# Patient Record
Sex: Male | Born: 1995 | Race: Black or African American | Hispanic: No | Marital: Single | Smoking: Current every day smoker
Health system: Southern US, Community
[De-identification: ages and names within clinical notes are randomized; demographics above are authoritative.]

## PROBLEM LIST (undated history)

## (undated) ENCOUNTER — Emergency Department (HOSPITAL_COMMUNITY): Payer: No Typology Code available for payment source

---
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
## 2018-10-21 ENCOUNTER — Emergency Department
Admission: EM | Admit: 2018-10-21 | Discharge: 2018-10-21 | Disposition: A | Discharge status: Home / Self Care | Payer: Federal, State, Local not specified - PPO | Attending: Emergency Medicine | Admitting: Emergency Medicine

## 2018-10-21 ENCOUNTER — Other Ambulatory Visit: Payer: Self-pay

## 2018-10-21 ENCOUNTER — Encounter: Payer: Self-pay | Admitting: Emergency Medicine

## 2018-10-21 DIAGNOSIS — Z202 Contact with and (suspected) exposure to infections with a predominantly sexual mode of transmission: Secondary | ICD-10-CM | POA: Insufficient documentation

## 2018-10-21 DIAGNOSIS — R300 Dysuria: Secondary | ICD-10-CM | POA: Diagnosis present

## 2018-10-21 DIAGNOSIS — F17210 Nicotine dependence, cigarettes, uncomplicated: Secondary | ICD-10-CM | POA: Diagnosis not present

## 2018-10-21 LAB — URINALYSIS, COMPLETE (UACMP) WITH MICROSCOPIC
Bacteria, UA: NONE SEEN
Bilirubin Urine: NEGATIVE
Glucose, UA: NEGATIVE mg/dL
Hgb urine dipstick: NEGATIVE
Ketones, ur: NEGATIVE mg/dL
Leukocytes,Ua: NEGATIVE
Nitrite: NEGATIVE
Protein, ur: NEGATIVE mg/dL
Specific Gravity, Urine: 1.018 (ref 1.005–1.030)
pH: 8 (ref 5.0–8.0)

## 2018-10-21 MED ORDER — METRONIDAZOLE 250 MG PO TABS: 250.0000 mg | ORAL_TABLET | Freq: Three times a day (TID) | ORAL | 0 refills | Status: AC

## 2018-10-21 MED ORDER — SULFACETAMIDE SODIUM 10 % OP SOLN: 1.0000 [drp] | OPHTHALMIC | 0 refills | Status: AC

## 2018-10-21 NOTE — Discharge Instructions (Signed)
Trichomonas is hard to evaluate in a male.  Due to your history of positive exposure will be treated prophylactically.  Advised to follow-up with the [facility] for further evaluation and treatment.

## 2018-10-21 NOTE — ED Provider Notes (Signed)
[facility] Emergency Department Provider Note   ____________________________________________   First MD Initiated Contact with Patient 10/21/18 [phone]     (approximate)  I have reviewed the triage vital signs and the nursing notes.   HISTORY  Chief Complaint SEXUALLY TRANSMITTED DISEASE    HPI Kyle Frost is a 23 y.o. male patient complaining dysuria when voiding.  Patient state he feels like he is not completely emptying his bladder.  Patient denies urethral discharge.  Patient denies high risk sexual encounters.  Patient just related that he found out his sexual partner has trichomonas.         History reviewed. No pertinent past medical history.  There are no active problems to display for this patient.   History reviewed. No pertinent surgical history.  Prior to Admission medications   Medication Sig Start Date End Date Taking? Authorizing Provider  metroNIDAZOLE (FLAGYL) 250 MG tablet Take 1 tablet (250 mg total) by mouth 3 (three) times daily for 7 days. 10/21/18 10/28/18  Joni ReiningSmith, Ronald K, PA-C  sulfacetamide (BLEPH-10) 10 % ophthalmic solution Place 1 drop into the right eye every 4 (four) hours for 10 days. 10/21/18 10/31/18  Joni ReiningSmith, Ronald K, PA-C    Allergies Patient has no known allergies.  No family history on file.  Social History Social History   Tobacco Use  . Smoking status: Current Every Day Smoker    Packs/day: 0.50    Types: Cigarettes  . Smokeless tobacco: Never Used  Substance Use Topics  . Alcohol use: No  . Drug use: Yes    Types: Marijuana    Review of Systems Constitutional: No fever/chills Eyes: No visual changes. ENT: No sore throat. Cardiovascular: Denies chest pain. Respiratory: Denies shortness of breath. Gastrointestinal: No abdominal pain.  No nausea, no vomiting.  No diarrhea.  No constipation. Genitourinary: Dysuria.   Musculoskeletal: Negative for back pain. Skin: Negative for rash.  Neurological: Negative for headaches, focal weakness or numbness.   ____________________________________________   PHYSICAL EXAM:  VITAL SIGNS: ED Triage Vitals [10/21/18 0929]  Enc Vitals Group     BP (!) 134/116     Pulse Rate (!) 54     Resp 20     Temp 98.4 F (36.9 C)     Temp Source Oral     SpO2 97 %     Weight 200 lb (90.7 kg)     Height 6\' 3"  (1.905 m)     Head Circumference      Peak Flow      Pain Score 3     Pain Loc      Pain Edu?      Excl. in GC?     Constitutional: Alert and oriented. Well appearing and in no acute distress. Cardiovascular: Normal rate, regular rhythm. Grossly normal heart sounds.  Good peripheral circulation. Respiratory: Normal respiratory effort.  No retractions. Lungs CTAB. Gastrointestinal: Soft and nontender. No distention. No abdominal bruits. No CVA tenderness. Genitourinary: No lesions or urethral drainage visible. Skin:  Skin is warm, dry and intact. No rash noted. Psychiatric: Mood and affect are normal. Speech and behavior are normal.  ____________________________________________   LABS (all labs ordered are listed, but only abnormal results are displayed)  Labs Reviewed  URINALYSIS, COMPLETE (UACMP) WITH MICROSCOPIC - Abnormal; Notable for the following components:      Result Value   Color, Urine YELLOW (*)    APPearance CLEAR (*)    All other components within normal limits  ____________________________________________  EKG   ____________________________________________  RADIOLOGY  ED MD interpretation:    Official radiology report(s): No results found.  ____________________________________________   PROCEDURES  Procedure(s) performed (including Critical Care):  Procedures   ____________________________________________   INITIAL IMPRESSION / ASSESSMENT AND PLAN / ED COURSE  As part of my medical decision making, I reviewed the following data within the [place] was evaluated in Emergency Department on 10/21/2018 for the symptoms described in the history of present illness. He was evaluated in the context of the global COVID-19 pandemic, which necessitated consideration that the patient might be at risk for infection with the SARS-CoV-2 virus that causes COVID-19. Institutional protocols and algorithms that pertain to the evaluation of patients at risk for COVID-19 are in a state of rapid change based on information released by regulatory bodies including the CDC and federal and state organizations. These policies and algorithms were followed during the patient's care in the ED.  Patient presents with dysuria.  Patient state he feels he is unable to fully empty his bladder.  After presenting urine specimen bladder scan was unremarkable.  While patient was awaiting urinalysis results he was informed by significant other says she tested positive for trichomonas.  Patient urinalysis unremarkable.  Patient will be prophylactically treated for trichomonas and advised follow-up with [facility] for for further evaluation and treatment.       ____________________________________________   FINAL CLINICAL IMPRESSION(S) / ED DIAGNOSES  Final diagnoses:  Possible exposure to STD     ED Discharge Orders         Ordered    sulfacetamide (BLEPH-10) 10 % ophthalmic solution  Every 4 hours     10/21/18 1011    metroNIDAZOLE (FLAGYL) 250 MG tablet  3 times daily     10/21/18 1034           Note:  This document was prepared using Dragon voice recognition software and may include unintentional dictation errors.    Sable Feil, PA-C 10/21/18 1035    Earleen Newport, MD 10/21/18 1335

## 2018-10-21 NOTE — ED Notes (Signed)
Pt is being discharged to home. Pt is Aox4, VSS, pt does not show any signs of distress. AVS/RX was given and explained to the pt and he verbalized understanding of all information.

## 2018-10-21 NOTE — ED Triage Notes (Signed)
Pt states when he pees it feels like not everything is coming out. Pt denies d/c

## 2019-02-09 ENCOUNTER — Ambulatory Visit: Payer: Self-pay

## 2019-04-08 ENCOUNTER — Other Ambulatory Visit: Payer: Self-pay

## 2019-04-08 ENCOUNTER — Emergency Department: Payer: Federal, State, Local not specified - PPO

## 2019-04-08 ENCOUNTER — Emergency Department
Admission: EM | Admit: 2019-04-08 | Discharge: 2019-04-08 | Disposition: A | Discharge status: Home / Self Care | Payer: Federal, State, Local not specified - PPO | Attending: Emergency Medicine | Admitting: Emergency Medicine

## 2019-04-08 ENCOUNTER — Encounter: Payer: Self-pay | Admitting: Emergency Medicine

## 2019-04-08 DIAGNOSIS — F17210 Nicotine dependence, cigarettes, uncomplicated: Secondary | ICD-10-CM | POA: Insufficient documentation

## 2019-04-08 DIAGNOSIS — F1210 Cannabis abuse, uncomplicated: Secondary | ICD-10-CM | POA: Insufficient documentation

## 2019-04-08 DIAGNOSIS — R112 Nausea with vomiting, unspecified: Secondary | ICD-10-CM | POA: Insufficient documentation

## 2019-04-08 DIAGNOSIS — R1011 Right upper quadrant pain: Secondary | ICD-10-CM | POA: Diagnosis present

## 2019-04-08 LAB — URINALYSIS, ROUTINE W REFLEX MICROSCOPIC
Bacteria, UA: NONE SEEN
Bilirubin Urine: NEGATIVE
Glucose, UA: NEGATIVE mg/dL
Hgb urine dipstick: NEGATIVE
Ketones, ur: NEGATIVE mg/dL
Nitrite: NEGATIVE
Protein, ur: 100 mg/dL — AB
Specific Gravity, Urine: 1.019 (ref 1.005–1.030)
Squamous Epithelial / LPF: NONE SEEN (ref 0–5)
pH: 9 — ABNORMAL HIGH (ref 5.0–8.0)

## 2019-04-08 LAB — COMPREHENSIVE METABOLIC PANEL
ALT: 28 U/L (ref 0–44)
AST: 29 U/L (ref 15–41)
Albumin: 4.8 g/dL (ref 3.5–5.0)
Alkaline Phosphatase: 59 U/L (ref 38–126)
Anion gap: 9 (ref 5–15)
BUN: 13 mg/dL (ref 6–20)
CO2: 23 mmol/L (ref 22–32)
Calcium: 9.5 mg/dL (ref 8.9–10.3)
Chloride: 107 mmol/L (ref 98–111)
Creatinine, Ser: 1 mg/dL (ref 0.61–1.24)
GFR calc Af Amer: >60 mL/min (ref >60)
GFR calc non Af Amer: >60 mL/min (ref >60)
Glucose, Bld: 110 mg/dL — ABNORMAL HIGH (ref 70–99)
Potassium: 4 mmol/L (ref 3.5–5.1)
Sodium: 139 mmol/L (ref 135–145)
Total Bilirubin: 1.6 mg/dL — ABNORMAL HIGH (ref 0.3–1.2)
Total Protein: 7.9 g/dL (ref 6.5–8.1)

## 2019-04-08 LAB — CBC WITH DIFFERENTIAL/PLATELET
Abs Immature Granulocytes: 0.06 10*3/uL (ref 0.00–0.07)
Basophils Absolute: 0.1 10*3/uL (ref 0.0–0.1)
Basophils Relative: 1 %
Eosinophils Absolute: 0 10*3/uL (ref 0.0–0.5)
Eosinophils Relative: 0 %
HCT: 47.4 % (ref 39.0–52.0)
Hemoglobin: 16 g/dL (ref 13.0–17.0)
Immature Granulocytes: 1 %
Lymphocytes Relative: 25 %
Lymphs Abs: 1.5 10*3/uL (ref 0.7–4.0)
MCH: 30 pg (ref 26.0–34.0)
MCHC: 33.8 g/dL (ref 30.0–36.0)
MCV: 88.8 fL (ref 80.0–100.0)
Monocytes Absolute: 0.4 10*3/uL (ref 0.1–1.0)
Monocytes Relative: 6 %
Neutro Abs: 4 10*3/uL (ref 1.7–7.7)
Neutrophils Relative %: 67 %
Platelets: 243 10*3/uL (ref 150–400)
RBC: 5.34 MIL/uL (ref 4.22–5.81)
RDW: 11.9 % (ref 11.5–15.5)
WBC: 6 10*3/uL (ref 4.0–10.5)
nRBC: 0 % (ref 0.0–0.2)

## 2019-04-08 LAB — LIPASE, BLOOD: Lipase: 22 U/L (ref 11–51)

## 2019-04-08 MED ORDER — ACETAMINOPHEN 500 MG PO TABS
1000.0000 mg | ORAL_TABLET | Freq: Once | ORAL | Status: AC
  Administered 2019-04-08: 1000 mg via ORAL
  Filled 2019-04-08: qty 2

## 2019-04-08 MED ORDER — ONDANSETRON HCL 4 MG/2ML IJ SOLN
4.0000 mg | Freq: Once | INTRAMUSCULAR | Status: AC
  Administered 2019-04-08: 4 mg via INTRAVENOUS
  Filled 2019-04-08: qty 2

## 2019-04-08 MED ORDER — SODIUM CHLORIDE 0.9 % IV BOLUS
1000.0000 mL | Freq: Once | INTRAVENOUS | Status: AC
  Administered 2019-04-08: 1000 mL via INTRAVENOUS

## 2019-04-08 NOTE — ED Provider Notes (Signed)
[facility] Emergency Department Provider Note  ____________________________________________   First MD Initiated Contact with Patient 04/08/19 1349     (approximate)  I have reviewed the triage vital signs and the nursing notes.   HISTORY  Chief Complaint Emesis and Abdominal Pain    HPI Kyle Frost is a 24 y.o. male otherwise healthy, no prior abdominal surgeries who comes in for abdominal pain.  Patient states that he took ecstasy around 1 AM.  He did drink alcohol with it.  He around 8 AM started feeling unwell.  He endorses having pain in his right upper quadrant that is constant, nothing makes better, nothing makes it worse associated with some nausea and vomiting.  Denies any cough or shortness of breath.  Denies having previously          History reviewed. No pertinent past medical history.  There are no problems to display for this patient.   History reviewed. No pertinent surgical history.  Prior to Admission medications   Not on File    Allergies Patient has no known allergies.  No family history on file.  Social History Social History   Tobacco Use  . Smoking status: Current Every Day Smoker    Packs/day: 0.50    Types: Cigarettes  . Smokeless tobacco: Never Used  Substance Use Topics  . Alcohol use: No  . Drug use: Yes    Types: Marijuana      Review of Systems Constitutional: No fever/chills Eyes: No visual changes. ENT: No sore throat. Cardiovascular: Denies chest pain. Respiratory: Denies shortness of breath. Gastrointestinal: Positive abdominal pain and nausea and vomiting Genitourinary: Negative for dysuria. Musculoskeletal: Negative for back pain. Skin: Negative for rash. Neurological: Negative for headaches, focal weakness or numbness. All other ROS negative ____________________________________________   PHYSICAL EXAM:  VITAL SIGNS: ED Triage Vitals  Enc Vitals Group     BP 04/08/19 1345  137/69     Pulse Rate 04/08/19 1345 (!) 58     Resp 04/08/19 1345 16     Temp 04/08/19 1345 98.2 F (36.8 C)     Temp Source 04/08/19 1345 Oral     SpO2 04/08/19 1345 100 %     Weight 04/08/19 1346 213 lb (96.6 kg)     Height 04/08/19 1346 6\' 3"  (1.905 m)     Head Circumference --      Peak Flow --      Pain Score 04/08/19 1346 7     Pain Loc --      Pain Edu? --      Excl. in GC? --     Constitutional: Alert and oriented. Well appearing and in no acute distress. Eyes: Conjunctivae are normal. EOMI. Head: Atraumatic. Nose: No congestion/rhinnorhea. Mouth/Throat: Mucous membranes are moist.   Neck: No stridor. Trachea Midline. FROM Cardiovascular: Normal rate, regular rhythm. Grossly normal heart sounds.  Good peripheral circulation. Respiratory: Normal respiratory effort.  No retractions. Lungs CTAB. Gastrointestinal: Slightly tender in the right upper quadrant without any rebound or guarding.  No lower abdominal tenderness Musculoskeletal: No lower extremity tenderness nor edema.  No joint effusions. Neurologic:  Normal speech and language. No gross focal neurologic deficits are appreciated.  Skin:  Skin is warm, dry and intact. No rash noted. Psychiatric: Mood and affect are normal. Speech and behavior are normal. GU: Deferred   ____________________________________________   LABS (all labs ordered are listed, but only abnormal results are displayed)  Labs Reviewed  CBC WITH DIFFERENTIAL/PLATELET  COMPREHENSIVE METABOLIC PANEL  LIPASE, BLOOD  URINALYSIS, ROUTINE W REFLEX MICROSCOPIC   ____________________________________________ RADIOLOGY Robert Bellow, personally viewed and evaluated these images (plain radiographs) as part of my medical decision making, as well as reviewing the written report by the radiologist.  ED MD interpretation: pending   Official radiology report(s): No results found.  ____________________________________________   PROCEDURES   Procedure(s) performed (including Critical Care):  Procedures   ____________________________________________   INITIAL IMPRESSION / ASSESSMENT AND PLAN / ED COURSE  Kyle Frost was evaluated in Emergency Department on 04/08/2019 for the symptoms described in the history of present illness. He was evaluated in the context of the global COVID-19 pandemic, which necessitated consideration that the patient might be at risk for infection with the SARS-CoV-2 virus that causes COVID-19. Institutional protocols and algorithms that pertain to the evaluation of patients at risk for COVID-19 are in a state of rapid change based on information released by regulatory bodies including the CDC and federal and state organizations. These policies and algorithms were followed during the patient's care in the ED.    Patient is a well-appearing 24 year old who comes in after using ecstasy who now has right upper quadrant pain.  Given patient is tender in his right upper quadrant will get a ultrasound to make sure is no evidence of gallbladder pathology.  Will get labs to evaluate for cholecystitis, pancreatitis.  Will give symptomatic treatment with Tylenol Zofran and fluids.  At this time I have low suspicion for SBO versus appendicitis or other acute abdominal pathologies and will hold off on CT scan.  Patient handed off to oncoming team pending labs, ultrasound and reevaluation.  If tolerating p.o. suspect discharge home       ____________________________________________   FINAL CLINICAL IMPRESSION(S) / ED DIAGNOSES   Final diagnoses:  RUQ pain      MEDICATIONS GIVEN DURING THIS VISIT:  Medications  ondansetron (ZOFRAN) injection 4 mg (has no administration in time range)  sodium chloride 0.9 % bolus 1,000 mL (has no administration in time range)  acetaminophen (TYLENOL) tablet 1,000 mg (has no administration in time range)     ED Discharge Orders    None       Note:  This document  was prepared using Dragon voice recognition software and may include unintentional dictation errors.   Vanessa C[place], MD 04/08/19 1420

## 2019-04-08 NOTE — ED Notes (Signed)
Pt provided with blankets

## 2019-04-08 NOTE — ED Triage Notes (Signed)
Patient reports taking half of an ecstacy pill at approximately 1 am. Reports since then, he has had severe abdominal pain, nausea and vomiting. Patient denies taking this pill or anything else from this person in the past.

## 2019-04-08 NOTE — ED Notes (Signed)
Pt up to use bathroom 

## 2019-04-08 NOTE — ED Provider Notes (Signed)
-----------------------------------------   5:19 PM on 04/08/2019 -----------------------------------------  Patient's work-up is essentially negative.  Reassuring lab work, normal ultrasound.  Patient states he is feeling much better at this time after fluids.  I discussed continued supportive care at home including plenty of fluids and rest.  Discussed return precautions for return of pain or fever.  Patient agreeable to plan of care.   Minna Antis, MD 04/08/19 1719

## 2019-06-09 ENCOUNTER — Emergency Department
Admission: EM | Admit: 2019-06-09 | Discharge: 2019-06-09 | Disposition: A | Discharge status: Home / Self Care | Payer: Federal, State, Local not specified - PPO | Attending: Emergency Medicine | Admitting: Emergency Medicine

## 2019-06-09 ENCOUNTER — Other Ambulatory Visit: Payer: Self-pay

## 2019-06-09 ENCOUNTER — Emergency Department: Payer: Federal, State, Local not specified - PPO

## 2019-06-09 DIAGNOSIS — M79602 Pain in left arm: Secondary | ICD-10-CM | POA: Insufficient documentation

## 2019-06-09 DIAGNOSIS — R2232 Localized swelling, mass and lump, left upper limb: Secondary | ICD-10-CM | POA: Insufficient documentation

## 2019-06-09 DIAGNOSIS — Z5321 Procedure and treatment not carried out due to patient leaving prior to being seen by health care provider: Secondary | ICD-10-CM | POA: Diagnosis not present
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
## 2019-10-27 ENCOUNTER — Ambulatory Visit: Payer: Federal, State, Local not specified - PPO

## 2019-10-28 ENCOUNTER — Other Ambulatory Visit: Payer: Self-pay

## 2019-10-28 ENCOUNTER — Ambulatory Visit: Payer: Self-pay | Admitting: Physician Assistant

## 2019-10-28 DIAGNOSIS — Z113 Encounter for screening for infections with a predominantly sexual mode of transmission: Secondary | ICD-10-CM

## 2019-10-28 DIAGNOSIS — Z202 Contact with and (suspected) exposure to infections with a predominantly sexual mode of transmission: Secondary | ICD-10-CM

## 2019-10-28 MED ORDER — AZITHROMYCIN 500 MG PO TABS
1000.0000 mg | ORAL_TABLET | Freq: Once | ORAL | Status: AC
  Administered 2019-10-28: 1000 mg via ORAL

## 2019-10-30 ENCOUNTER — Encounter: Payer: Self-pay | Admitting: Physician Assistant

## 2019-10-30 NOTE — Progress Notes (Signed)
   [facility] Department STI clinic/screening visit  Subjective:  Kyle Frost is a 24 y.o. male being seen today for an STI screening visit. The patient reports they do have symptoms.    Patient has the following medical conditions:  There are no problems to display for this patient.    Chief Complaint  Patient presents with  . SEXUALLY TRANSMITTED DISEASE    screening    HPI  Patient reports that he has had a slight clearish discharge for 1 week and he is a contact to Chlamydia.  Denies other symptoms, chronic conditions, surgeries and regular medicines.  Reports last HIV test was about 6 months ago.     See flowsheet for further details and programmatic requirements.    The following portions of the patient's history were reviewed and updated as appropriate: allergies, current medications, past medical history, past social history, past surgical history and problem list.  Objective:  There were no vitals filed for this visit.  Physical Exam Constitutional:      General: He is not in acute distress.    Appearance: Normal appearance.  HENT:     Head: Normocephalic and atraumatic.  Eyes:     Conjunctiva/sclera: Conjunctivae normal.  Pulmonary:     Effort: Pulmonary effort is normal.  Skin:    General: Skin is warm and dry.  Neurological:     Mental Status: He is alert and oriented to person, place, and time.  Psychiatric:        Mood and Affect: Mood normal.        Behavior: Behavior normal.        Thought Content: Thought content normal.        Judgment: Judgment normal.       Assessment and Plan:  Kyle Frost is a 24 y.o. male presenting to the [facility] Department for STI screening  1. Screening for STD (sexually transmitted disease) Patient into clinic with symptoms. Patient declines screening exam and blood work today.  Requests treatment as a contact to Chlamydia only. Rec condoms with all sex.   2. Chlamydia  contact Will treat as a contact to Chlamydia with Azithromycin 1 g po DOT today. No sex for 7 days and until after partner completes treatment. RTC for re-treatment if vomits < 2 hr after taking medicine. - azithromycin (ZITHROMAX) tablet 1,000 mg     No follow-ups on file.  No future appointments.  Matt Holmes, PA

## 2020-02-27 ENCOUNTER — Encounter: Payer: Self-pay | Admitting: Emergency Medicine

## 2020-02-27 ENCOUNTER — Emergency Department: Payer: Federal, State, Local not specified - PPO

## 2020-02-27 ENCOUNTER — Other Ambulatory Visit: Payer: Self-pay

## 2020-02-27 ENCOUNTER — Emergency Department
Admission: EM | Admit: 2020-02-27 | Discharge: 2020-02-27 | Disposition: A | Discharge status: Home / Self Care | Payer: Federal, State, Local not specified - PPO | Attending: Emergency Medicine | Admitting: Emergency Medicine

## 2020-02-27 DIAGNOSIS — Z79899 Other long term (current) drug therapy: Secondary | ICD-10-CM | POA: Diagnosis not present

## 2020-02-27 DIAGNOSIS — M25552 Pain in left hip: Secondary | ICD-10-CM | POA: Insufficient documentation

## 2020-02-27 DIAGNOSIS — F17210 Nicotine dependence, cigarettes, uncomplicated: Secondary | ICD-10-CM | POA: Diagnosis not present

## 2020-02-27 MED ORDER — IBUPROFEN 800 MG PO TABS: 800.0000 mg | ORAL_TABLET | Freq: Three times a day (TID) | ORAL | 0 refills | Status: DC | PRN

## 2020-02-27 MED ORDER — CYCLOBENZAPRINE HCL 5 MG PO TABS: 5.0000 mg | ORAL_TABLET | Freq: Three times a day (TID) | ORAL | 0 refills | Status: DC | PRN
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
## 2020-02-27 NOTE — Discharge Instructions (Signed)
Your exam and XR are consistent with a hip bursitis, and hamstring strain. Take the prescription meds as directed. Apply ice and/or moist heat to reduce symptoms. Return to the ED or see Ortho for ongoing symptoms.

## 2020-07-27 ENCOUNTER — Encounter: Payer: Self-pay | Admitting: Emergency Medicine

## 2020-07-27 ENCOUNTER — Emergency Department
Admission: EM | Admit: 2020-07-27 | Discharge: 2020-07-27 | Disposition: A | Discharge status: Home / Self Care | Payer: Federal, State, Local not specified - PPO | Attending: Physician Assistant | Admitting: Physician Assistant

## 2020-07-27 ENCOUNTER — Other Ambulatory Visit: Payer: Self-pay

## 2020-07-27 DIAGNOSIS — S39012A Strain of muscle, fascia and tendon of lower back, initial encounter: Secondary | ICD-10-CM | POA: Diagnosis not present

## 2020-07-27 DIAGNOSIS — Y92410 Unspecified street and highway as the place of occurrence of the external cause: Secondary | ICD-10-CM | POA: Insufficient documentation

## 2020-07-27 DIAGNOSIS — L299 Pruritus, unspecified: Secondary | ICD-10-CM | POA: Insufficient documentation

## 2020-07-27 DIAGNOSIS — S3992XA Unspecified injury of lower back, initial encounter: Secondary | ICD-10-CM | POA: Diagnosis present

## 2020-07-27 DIAGNOSIS — F17210 Nicotine dependence, cigarettes, uncomplicated: Secondary | ICD-10-CM | POA: Insufficient documentation

## 2020-07-27 DIAGNOSIS — Z5321 Procedure and treatment not carried out due to patient leaving prior to being seen by health care provider: Secondary | ICD-10-CM | POA: Insufficient documentation

## 2020-07-27 MED ORDER — DIPHENHYDRAMINE HCL 25 MG PO CAPS
50.0000 mg | ORAL_CAPSULE | Freq: Once | ORAL | Status: AC
  Administered 2020-07-27: 50 mg via ORAL
  Filled 2020-07-27: qty 2

## 2020-07-27 MED ORDER — LIDOCAINE 5 % EX PTCH: 1.0000 | MEDICATED_PATCH | Freq: Two times a day (BID) | CUTANEOUS | 0 refills | Status: AC | PRN

## 2020-07-27 MED ORDER — CYCLOBENZAPRINE HCL 5 MG PO TABS: 5.0000 mg | ORAL_TABLET | Freq: Three times a day (TID) | ORAL | 0 refills | Status: DC | PRN
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
## 2020-07-28 ENCOUNTER — Emergency Department
Admission: EM | Admit: 2020-07-28 | Discharge: 2020-07-28 | Disposition: A | Discharge status: Home / Self Care | Payer: Federal, State, Local not specified - PPO | Source: Home / Self Care

## 2020-07-28 NOTE — ED Notes (Signed)
No answer when called several times from lobby 

## 2020-08-09 ENCOUNTER — Other Ambulatory Visit: Payer: Self-pay | Admitting: Chiropractor

## 2020-08-09 ENCOUNTER — Ambulatory Visit
Admission: RE | Admit: 2020-08-09 | Discharge: 2020-08-09 | Disposition: A | Discharge status: Home / Self Care | Payer: Federal, State, Local not specified - PPO | Source: Ambulatory Visit | Attending: Chiropractor | Admitting: Chiropractor

## 2020-08-09 ENCOUNTER — Ambulatory Visit
Admission: RE | Admit: 2020-08-09 | Discharge: 2020-08-09 | Disposition: A | Discharge status: Home / Self Care | Payer: Federal, State, Local not specified - PPO | Attending: Chiropractor | Admitting: Chiropractor

## 2020-08-09 DIAGNOSIS — S233XXA Sprain of ligaments of thoracic spine, initial encounter: Secondary | ICD-10-CM | POA: Diagnosis present

## 2020-08-09 DIAGNOSIS — S335XXA Sprain of ligaments of lumbar spine, initial encounter: Secondary | ICD-10-CM | POA: Insufficient documentation

## 2020-11-24 ENCOUNTER — Emergency Department: Payer: Federal, State, Local not specified - PPO

## 2020-11-24 ENCOUNTER — Emergency Department
Admission: EM | Admit: 2020-11-24 | Discharge: 2020-11-25 | Disposition: A | Discharge status: Home / Self Care | Payer: Federal, State, Local not specified - PPO | Attending: Emergency Medicine | Admitting: Emergency Medicine

## 2020-11-24 DIAGNOSIS — S82832A Other fracture of upper and lower end of left fibula, initial encounter for closed fracture: Secondary | ICD-10-CM

## 2020-11-24 DIAGNOSIS — S8262XA Displaced fracture of lateral malleolus of left fibula, initial encounter for closed fracture: Secondary | ICD-10-CM | POA: Diagnosis not present

## 2020-11-24 DIAGNOSIS — T1490XA Injury, unspecified, initial encounter: Secondary | ICD-10-CM

## 2020-11-24 DIAGNOSIS — S9305XA Dislocation of left ankle joint, initial encounter: Secondary | ICD-10-CM | POA: Insufficient documentation

## 2020-11-24 DIAGNOSIS — S99912A Unspecified injury of left ankle, initial encounter: Secondary | ICD-10-CM | POA: Diagnosis present

## 2020-11-24 DIAGNOSIS — Z23 Encounter for immunization: Secondary | ICD-10-CM | POA: Diagnosis not present

## 2020-11-24 DIAGNOSIS — F17210 Nicotine dependence, cigarettes, uncomplicated: Secondary | ICD-10-CM | POA: Diagnosis not present

## 2020-11-24 DIAGNOSIS — M25572 Pain in left ankle and joints of left foot: Secondary | ICD-10-CM | POA: Diagnosis not present

## 2020-11-24 MED ORDER — NAPROXEN 500 MG PO TABS: 500.0000 mg | ORAL_TABLET | Freq: Two times a day (BID) | ORAL | 0 refills | Status: DC

## 2020-11-24 MED ORDER — TETANUS-DIPHTH-ACELL PERTUSSIS 5-2.5-18.5 LF-MCG/0.5 IM SUSY
0.5000 mL | PREFILLED_SYRINGE | Freq: Once | INTRAMUSCULAR | Status: AC
  Administered 2020-11-24: 0.5 mL via INTRAMUSCULAR
  Filled 2020-11-24: qty 0.5

## 2020-11-24 MED ORDER — OXYCODONE-ACETAMINOPHEN 5-325 MG PO TABS
1.0000 | ORAL_TABLET | Freq: Once | ORAL | Status: AC
  Administered 2020-11-24: 1 via ORAL
  Filled 2020-11-24: qty 1

## 2020-11-24 MED ORDER — OXYCODONE-ACETAMINOPHEN 5-325 MG PO TABS: 1.0000 | ORAL_TABLET | Freq: Four times a day (QID) | ORAL | 0 refills | Status: DC | PRN

## 2020-11-24 MED ORDER — HYDROMORPHONE HCL 1 MG/ML IJ SOLN
1.0000 mg | INTRAMUSCULAR | Status: AC
  Administered 2020-11-24: 1 mg via INTRAMUSCULAR
  Filled 2020-11-24: qty 1
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
## 2020-11-24 NOTE — ED Notes (Signed)
Abrasions on left ankle, left shin and below left knee were cleaned, dried, non-adherent bandages applied and wrapped in kling. Patient tolerated procedure well. Left hand soaking in NS and chlorhexidine at this time per Dr. Scotty Court.
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
## 2020-11-24 NOTE — ED Triage Notes (Signed)
Obvious deformity of the left ankle with decreased range of motion.

## 2020-11-24 NOTE — ED Notes (Signed)
Girlfriend at bedside.

## 2020-11-25 NOTE — ED Notes (Signed)
Report given to Amanda RN

## 2020-12-04 ENCOUNTER — Other Ambulatory Visit: Payer: Self-pay | Admitting: Orthopedic Surgery

## 2020-12-05 ENCOUNTER — Other Ambulatory Visit: Payer: Self-pay

## 2020-12-05 ENCOUNTER — Encounter
Admission: RE | Admit: 2020-12-05 | Discharge: 2020-12-05 | Disposition: A | Discharge status: Home / Self Care | Payer: No Typology Code available for payment source | Source: Ambulatory Visit | Attending: Orthopedic Surgery | Admitting: Orthopedic Surgery

## 2020-12-05 NOTE — Patient Instructions (Signed)
Your procedure is scheduled on: Thurs 11/3 Report to registration desk then to Day Surgery. To find out your arrival time please call [phone] between 1PM - 3PM on Wed. 11/2.  Remember: Instructions that are not followed completely may result in serious medical risk,  up to and including death, or upon the discretion of your surgeon and anesthesiologist your  surgery may need to be rescheduled.     _X__ 1. Do not eat food after midnight the night before your procedure.                 No chewing gum or hard candies. You may drink clear liquids up to 2 hours                 before you are scheduled to arrive for your surgery- DO not drink clear                 liquids within 2 hours of the start of your surgery.                 Clear Liquids include:  water, apple juice without pulp, clear Gatorade, G2 or                  Gatorade Zero (avoid Red/Purple/Blue), Black Coffee or Tea (Do not add                 anything to coffee or tea).  __X__2.  On the morning of surgery brush your teeth with toothpaste and water, you                may rinse your mouth with mouthwash if you wish.  Do not swallow any   toothpaste of mouthwash.     _X__ 3.  No Alcohol for 24 hours before or after surgery.   _X__ 4.  Do Not Smoke or use e-cigarettes For 24 Hours Prior to Your Surgery.                 Do not use any chewable tobacco products for at least 6 hours prior to                 Surgery.  _X__  5.  Do not use any recreational drugs (marijuana, cocaine, heroin, ecstasy,  MDMA or other) For at least one week prior to your surgery.   Combination of these drugs with anesthesia may have life threatening   results.  ____  6.  Bring all medications with you on the day of surgery if instructed.   __x__  7.  Notify your doctor if there is any change in your medical condition      (cold, fever, infections).     Do not wear jewelry,  Do not wear lotions,  You may wear  deodorant. Do not shave 48 hours prior to surgery. Men may shave face and neck. Do not bring valuables to the hospital.    [facility] is not responsible for any belongings or valuables.  Contacts, dentures or bridgework may not be worn into surgery. Leave your suitcase in the car. After surgery it may be brought to your room. For patients admitted to the hospital, discharge time is determined by your treatment team.   Patients discharged the day of surgery will not be allowed to drive home.   Make arrangements for someone to be with you for the first 24 hours of your Same Day Discharge.    Please read  over the following fact sheets that you were given:      __x__ Take these medicines the morning of surgery with A SIP OF WATER:    1. none  2.   3.   4.  5.  6.  ____ Fleet Enema (as directed)   __x__ shower the night before and the morning of surgery.  ____ Use Benzoyl Peroxide Gel as instructed  ____ Use inhalers on the day of surgery  ____ Stop metformin 2 days prior to surgery    ____ Take 1/2 of usual insulin dose the night before surgery. No insulin the morning          of surgery.   ____ Stop Coumadin/Plavix/aspirin on   __x__ Stop Anti-inflammatories Naprosyn, ibuprofen or aspirin until after the surgery.    ____ Stop supplements until after surgery.    ____ Bring C-Pap to the hospital.    If you have any questions regarding your pre-procedure instructions,  Please call Pre-admit Testing at [phone]

## 2020-12-07 MED ORDER — FAMOTIDINE 20 MG PO TABS
20.0000 mg | ORAL_TABLET | Freq: Once | ORAL | Status: AC
  Administered 2020-12-14: 20 mg via ORAL

## 2020-12-07 MED ORDER — CHLORHEXIDINE GLUCONATE CLOTH 2 % EX PADS: 6.0000 | MEDICATED_PAD | Freq: Once | CUTANEOUS | Status: DC

## 2020-12-07 MED ORDER — LACTATED RINGERS IV SOLN: INTRAVENOUS | Status: DC

## 2020-12-07 MED ORDER — CHLORHEXIDINE GLUCONATE 0.12 % MT SOLN
15.0000 mL | Freq: Once | OROMUCOSAL | Status: AC
  Administered 2020-12-14: 15 mL via OROMUCOSAL

## 2020-12-07 MED ORDER — CEFAZOLIN SODIUM-DEXTROSE 2-4 GM/100ML-% IV SOLN: 2.0000 g | INTRAVENOUS | Status: AC

## 2020-12-07 MED ORDER — ACETAMINOPHEN 500 MG PO TABS: 1000.0000 mg | ORAL_TABLET | ORAL | Status: AC

## 2020-12-07 MED ORDER — ORAL CARE MOUTH RINSE: 15.0000 mL | Freq: Once | OROMUCOSAL | Status: AC

## 2020-12-14 ENCOUNTER — Ambulatory Visit: Payer: Federal, State, Local not specified - PPO | Admitting: Certified Registered"

## 2020-12-14 ENCOUNTER — Encounter: Payer: Self-pay | Admitting: Orthopedic Surgery

## 2020-12-14 ENCOUNTER — Ambulatory Visit: Payer: Federal, State, Local not specified - PPO

## 2020-12-14 ENCOUNTER — Encounter
Admission: RE | Disposition: A | Discharge status: Home / Self Care | Payer: Self-pay | Source: Home / Self Care | Attending: Orthopedic Surgery

## 2020-12-14 ENCOUNTER — Ambulatory Visit
Admission: RE | Admit: 2020-12-14 | Discharge: 2020-12-14 | Disposition: A | Discharge status: Home / Self Care | Payer: Federal, State, Local not specified - PPO | Attending: Orthopedic Surgery | Admitting: Orthopedic Surgery

## 2020-12-14 DIAGNOSIS — S93432A Sprain of tibiofibular ligament of left ankle, initial encounter: Secondary | ICD-10-CM | POA: Insufficient documentation

## 2020-12-14 DIAGNOSIS — S8263XA Displaced fracture of lateral malleolus of unspecified fibula, initial encounter for closed fracture: Secondary | ICD-10-CM

## 2020-12-14 DIAGNOSIS — S8261XA Displaced fracture of lateral malleolus of right fibula, initial encounter for closed fracture: Secondary | ICD-10-CM

## 2020-12-14 HISTORY — PX: ORIF ANKLE FRACTURE: SHX5408

## 2020-12-14 HISTORY — PX: SYNDESMOSIS REPAIR: SHX5182

## 2020-12-14 SURGERY — OPEN REDUCTION INTERNAL FIXATION (ORIF) ANKLE FRACTURE
Anesthesia: General | Site: Ankle | Laterality: Left

## 2020-12-14 MED ORDER — ONDANSETRON HCL 4 MG PO TABS: 4.0000 mg | ORAL_TABLET | Freq: Three times a day (TID) | ORAL | 0 refills | Status: AC | PRN

## 2020-12-14 MED ORDER — BUPIVACAINE HCL (PF) 0.5 % IJ SOLN
INTRAMUSCULAR | Status: AC
  Filled 2020-12-14: qty 30

## 2020-12-14 MED ORDER — NEOMYCIN-POLYMYXIN B GU 40-200000 IR SOLN
Status: AC
  Filled 2020-12-14: qty 2

## 2020-12-14 MED ORDER — OXYCODONE HCL 5 MG PO TABS: 5.0000 mg | ORAL_TABLET | ORAL | 0 refills | Status: AC | PRN

## 2020-12-14 MED ORDER — SODIUM CHLORIDE 0.9 % IR SOLN
Status: DC | PRN
  Administered 2020-12-14: 500 mL

## 2020-12-14 MED ORDER — PROPOFOL 10 MG/ML IV BOLUS
INTRAVENOUS | Status: DC | PRN
  Administered 2020-12-14: 30 mg via INTRAVENOUS
  Administered 2020-12-14: 200 mg via INTRAVENOUS
  Administered 2020-12-14: 50 mg via INTRAVENOUS
  Administered 2020-12-14: 40 mg via INTRAVENOUS
  Administered 2020-12-14 (×2): 50 mg via INTRAVENOUS

## 2020-12-14 MED ORDER — FENTANYL CITRATE (PF) 100 MCG/2ML IJ SOLN
INTRAMUSCULAR | Status: AC
  Filled 2020-12-14: qty 2

## 2020-12-14 MED ORDER — CHLORHEXIDINE GLUCONATE 0.12 % MT SOLN
OROMUCOSAL | Status: AC
  Filled 2020-12-14: qty 15

## 2020-12-14 MED ORDER — KETAMINE HCL 50 MG/5ML IJ SOSY
PREFILLED_SYRINGE | INTRAMUSCULAR | Status: AC
  Filled 2020-12-14: qty 5

## 2020-12-14 MED ORDER — HYDROMORPHONE HCL 1 MG/ML IJ SOLN
INTRAMUSCULAR | Status: AC
  Filled 2020-12-14: qty 1

## 2020-12-14 MED ORDER — PROPOFOL 1000 MG/100ML IV EMUL
INTRAVENOUS | Status: AC
  Filled 2020-12-14: qty 300

## 2020-12-14 MED ORDER — ASPIRIN EC 325 MG PO TBEC: 325.0000 mg | DELAYED_RELEASE_TABLET | Freq: Two times a day (BID) | ORAL | 0 refills | Status: AC

## 2020-12-14 MED ORDER — ONDANSETRON HCL 4 MG/2ML IJ SOLN
INTRAMUSCULAR | Status: DC | PRN
  Administered 2020-12-14: 4 mg via INTRAVENOUS

## 2020-12-14 MED ORDER — DEXMEDETOMIDINE HCL IN NACL 200 MCG/50ML IV SOLN
INTRAVENOUS | Status: DC | PRN
  Administered 2020-12-14 (×2): 8 ug via INTRAVENOUS
  Administered 2020-12-14 (×2): 12 ug via INTRAVENOUS

## 2020-12-14 MED ORDER — PROPOFOL 10 MG/ML IV BOLUS
INTRAVENOUS | Status: AC
  Filled 2020-12-14: qty 20

## 2020-12-14 MED ORDER — ACETAMINOPHEN 500 MG PO TABS
1000.0000 mg | ORAL_TABLET | Freq: Four times a day (QID) | ORAL | Status: DC | PRN
  Administered 2020-12-14: 1000 mg via ORAL

## 2020-12-14 MED ORDER — HYDROMORPHONE HCL 1 MG/ML IJ SOLN
INTRAMUSCULAR | Status: DC | PRN
  Administered 2020-12-14 (×2): .5 mg via INTRAVENOUS

## 2020-12-14 MED ORDER — FENTANYL CITRATE (PF) 100 MCG/2ML IJ SOLN
INTRAMUSCULAR | Status: AC
  Administered 2020-12-14: 25 ug via INTRAVENOUS
  Filled 2020-12-14: qty 2

## 2020-12-14 MED ORDER — CEFAZOLIN SODIUM-DEXTROSE 2-4 GM/100ML-% IV SOLN
INTRAVENOUS | Status: AC
  Filled 2020-12-14: qty 100

## 2020-12-14 MED ORDER — FENTANYL CITRATE (PF) 100 MCG/2ML IJ SOLN
25.0000 ug | INTRAMUSCULAR | Status: DC | PRN
  Administered 2020-12-14 (×2): 25 ug via INTRAVENOUS

## 2020-12-14 MED ORDER — FAMOTIDINE 20 MG PO TABS
ORAL_TABLET | ORAL | Status: AC
  Filled 2020-12-14: qty 1

## 2020-12-14 MED ORDER — BACITRACIN-NEOMYCIN-POLYMYXIN 400-5-5000 EX OINT
TOPICAL_OINTMENT | CUTANEOUS | Status: AC
  Filled 2020-12-14: qty 2

## 2020-12-14 MED ORDER — ONDANSETRON HCL 4 MG/2ML IJ SOLN: 4.0000 mg | Freq: Once | INTRAMUSCULAR | Status: DC | PRN

## 2020-12-14 MED ORDER — KETAMINE HCL 50 MG/ML IJ SOLN
INTRAMUSCULAR | Status: DC | PRN
  Administered 2020-12-14: 30 mg via INTRAMUSCULAR
  Administered 2020-12-14: 20 mg via INTRAMUSCULAR

## 2020-12-14 MED ORDER — CEFAZOLIN SODIUM-DEXTROSE 2-4 GM/100ML-% IV SOLN
2.0000 g | Freq: Once | INTRAVENOUS | Status: AC
  Administered 2020-12-14: 2 g via INTRAVENOUS

## 2020-12-14 MED ORDER — LIDOCAINE HCL (CARDIAC) PF 100 MG/5ML IV SOSY
PREFILLED_SYRINGE | INTRAVENOUS | Status: DC | PRN
  Administered 2020-12-14: 100 mg via INTRAVENOUS

## 2020-12-14 MED ORDER — MIDAZOLAM HCL 2 MG/2ML IJ SOLN
INTRAMUSCULAR | Status: DC | PRN
  Administered 2020-12-14: 2 mg via INTRAVENOUS

## 2020-12-14 MED ORDER — ACETAMINOPHEN 500 MG PO TABS
ORAL_TABLET | ORAL | Status: AC
  Filled 2020-12-14: qty 2

## 2020-12-14 MED ORDER — KETOROLAC TROMETHAMINE 30 MG/ML IJ SOLN
INTRAMUSCULAR | Status: DC | PRN
  Administered 2020-12-14: 30 mg via INTRAVENOUS

## 2020-12-14 MED ORDER — FENTANYL CITRATE (PF) 100 MCG/2ML IJ SOLN
INTRAMUSCULAR | Status: DC | PRN
  Administered 2020-12-14 (×4): 50 ug via INTRAVENOUS

## 2020-12-14 MED ORDER — MIDAZOLAM HCL 2 MG/2ML IJ SOLN
INTRAMUSCULAR | Status: AC
  Filled 2020-12-14: qty 2

## 2020-12-14 MED ORDER — BUPIVACAINE HCL 0.25 % IJ SOLN
INTRAMUSCULAR | Status: DC | PRN
  Administered 2020-12-14: 30 mL

## 2020-12-14 SURGICAL SUPPLY — 60 items
BIT DRILL Q/COUPLING 1 (BIT) ×1 IMPLANT
BIT DRILL QC 2.5X135 (BIT) ×1 IMPLANT
BLADE SURG 15 STRL LF DISP TIS (BLADE) ×1 IMPLANT
BLADE SURG 15 STRL SS (BLADE) ×2
BNDG CMPR STD VLCR NS LF 5.8X4 (GAUZE/BANDAGES/DRESSINGS) ×2
BNDG COHESIVE 4X5 TAN ST LF (GAUZE/BANDAGES/DRESSINGS) ×2 IMPLANT
BNDG ELASTIC 4X5.8 VLCR NS LF (GAUZE/BANDAGES/DRESSINGS) ×4 IMPLANT
BNDG ESMARK 6X12 TAN STRL LF (GAUZE/BANDAGES/DRESSINGS) ×2 IMPLANT
CUFF TOURN SGL QUICK 24 (TOURNIQUET CUFF)
CUFF TOURN SGL QUICK 34 (TOURNIQUET CUFF)
CUFF TRNQT CYL 24X4X16.5-23 (TOURNIQUET CUFF) IMPLANT
CUFF TRNQT CYL 34X4.125X (TOURNIQUET CUFF) IMPLANT
DRAPE FLUOR MINI C-ARM 54X84 (DRAPES) ×2 IMPLANT
DRAPE INCISE IOBAN 66X45 STRL (DRAPES) ×2 IMPLANT
DRAPE U-SHAPE 47X51 STRL (DRAPES) ×2 IMPLANT
DURAPREP 26ML APPLICATOR (WOUND CARE) ×4 IMPLANT
ELECT REM PT RETURN 9FT ADLT (ELECTROSURGICAL) ×2
ELECTRODE REM PT RTRN 9FT ADLT (ELECTROSURGICAL) ×1 IMPLANT
GAUZE 4X4 16PLY ~~LOC~~+RFID DBL (SPONGE) ×2 IMPLANT
GAUZE SPONGE 4X4 12PLY STRL (GAUZE/BANDAGES/DRESSINGS) ×2 IMPLANT
GAUZE XEROFORM 1X8 LF (GAUZE/BANDAGES/DRESSINGS) ×2 IMPLANT
GLOVE SURG ORTHO LTX SZ9 (GLOVE) ×4 IMPLANT
GLOVE SURG UNDER POLY LF SZ9 (GLOVE) ×2 IMPLANT
GOWN STRL REUS TWL 2XL XL LVL4 (GOWN DISPOSABLE) ×2 IMPLANT
GOWN STRL REUS W/ TWL LRG LVL3 (GOWN DISPOSABLE) ×1 IMPLANT
GOWN STRL REUS W/TWL LRG LVL3 (GOWN DISPOSABLE) ×2
IMPL TIGHTROP W/DRV K-LESS (Anchor) IMPLANT
IMPLANT TIGHTROPE W/DRV K-LESS (Anchor) ×2 IMPLANT
KIT TURNOVER KIT A (KITS) ×2 IMPLANT
LABEL OR SOLS (LABEL) ×2 IMPLANT
MANIFOLD NEPTUNE II (INSTRUMENTS) ×2 IMPLANT
NS IRRIG 1000ML POUR BTL (IV SOLUTION) ×2 IMPLANT
PACK EXTREMITY ARMC (MISCELLANEOUS) ×2 IMPLANT
PAD ABD DERMACEA PRESS 5X9 (GAUZE/BANDAGES/DRESSINGS) ×4 IMPLANT
PAD CAST CTTN 4X4 STRL (SOFTGOODS) ×3 IMPLANT
PADDING CAST COTTON 4X4 STRL (SOFTGOODS) ×6
PLATE LCP 3.5 1/3 TUB 10HX117 (Plate) ×1 IMPLANT
SCREW CANC FT 4.0X20 (Screw) ×2 IMPLANT
SCREW CANC FT ST SFS 4X16 (Screw) ×1 IMPLANT
SCREW CORTEX 3.5 14MM (Screw) ×2 IMPLANT
SCREW CORTEX 3.5 16MM (Screw) ×3 IMPLANT
SCREW CORTEX 3.5 20MM (Screw) ×1 IMPLANT
SCREW LOCK CORT ST 3.5X14 (Screw) IMPLANT
SCREW LOCK CORT ST 3.5X16 (Screw) IMPLANT
SCREW LOCK CORT ST 3.5X20 (Screw) IMPLANT
SCREW TI CORTEX S/TAP 4.5X58 (Screw) ×1 IMPLANT
SPLINT CAST 1 STEP 4X30 (MISCELLANEOUS) ×4 IMPLANT
SPONGE T-LAP 18X18 ~~LOC~~+RFID (SPONGE) ×2 IMPLANT
STAPLER SKIN PROX 35W (STAPLE) ×2 IMPLANT
STOCKINETTE STRL 6IN 960660 (GAUZE/BANDAGES/DRESSINGS) ×2 IMPLANT
STRIP CLOSURE SKIN 1/2X4 (GAUZE/BANDAGES/DRESSINGS) ×4 IMPLANT
SUT FIBERWIRE #2 38 BLUE 1/2 (SUTURE) ×2
SUT FIBERWIRE #2 38 T-5 BLUE (SUTURE) ×2
SUT VIC AB 2-0 SH 27 (SUTURE) ×6
SUT VIC AB 2-0 SH 27XBRD (SUTURE) ×2 IMPLANT
SUTURE FIBERWR #2 38 BLUE 1/2 (SUTURE) IMPLANT
SUTURE FIBERWR #2 38 T-5 BLUE (SUTURE) IMPLANT
SYR 30ML LL (SYRINGE) ×2 IMPLANT
TAPE PAPER 2X10 WHT MICROPORE (GAUZE/BANDAGES/DRESSINGS) ×2 IMPLANT
WATER STERILE IRR 500ML POUR (IV SOLUTION) ×2 IMPLANT

## 2020-12-14 NOTE — Anesthesia Postprocedure Evaluation (Signed)
Anesthesia Post Note  Patient: Kyle Frost  Procedure(s) Performed: OPEN REDUCTION INTERNAL FIXATION (ORIF) ANKLE FRACTURE (Left: Ankle) SYNDESMOSIS REPAIR (Left: Ankle)  Patient location during evaluation: PACU Anesthesia Type: General Level of consciousness: awake Pain management: pain level controlled Vital Signs Assessment: post-procedure vital signs reviewed and stable Respiratory status: spontaneous breathing Cardiovascular status: blood pressure returned to baseline Postop Assessment: no headache and no backache Anesthetic complications: no   No notable events documented.   Last Vitals:  Vitals:   12/14/20 1110 12/14/20 1118  BP: 128/68   Pulse: (!) 56   Resp: 12   Temp: 36.4 C 36.8 C  SpO2: 100%     Last Pain:  Vitals:   12/14/20 0631  PainSc: 0-No pain                 Foye Deer Reannah Totten

## 2020-12-14 NOTE — Anesthesia Preprocedure Evaluation (Signed)
Anesthesia Evaluation  Patient identified by MRN, date of birth, ID band Patient awake    Reviewed: Allergy & Precautions, H&P , NPO status , Patient's Chart, lab work & pertinent test results, reviewed documented beta blocker date and time   Airway Mallampati: II  TM Distance: >3 FB Neck ROM: full    Dental  (+) Teeth Intact   Pulmonary neg pulmonary ROS, Current Smoker,    Pulmonary exam normal        Cardiovascular Exercise Tolerance: Good negative cardio ROS Normal cardiovascular exam Rate:Normal     Neuro/Psych negative neurological ROS  negative psych ROS   GI/Hepatic negative GI ROS, Neg liver ROS,   Endo/Other  negative endocrine ROS  Renal/GU negative Renal ROS  negative genitourinary   Musculoskeletal   Abdominal   Peds  Hematology negative hematology ROS (+)   Anesthesia Other Findings   Reproductive/Obstetrics negative OB ROS                             Anesthesia Physical Anesthesia Plan  ASA: 2  Anesthesia Plan: General LMA   Post-op Pain Management:    Induction:   PONV Risk Score and Plan: 2  Airway Management Planned:   Additional Equipment:   Intra-op Plan:   Post-operative Plan:   Informed Consent: I have reviewed the patients History and Physical, chart, labs and discussed the procedure including the risks, benefits and alternatives for the proposed anesthesia with the patient or authorized representative who has indicated his/her understanding and acceptance.       Plan Discussed with: CRNA  Anesthesia Plan Comments:         Anesthesia Quick Evaluation

## 2020-12-14 NOTE — Transfer of Care (Signed)
Immediate Anesthesia Transfer of Care Note  Patient: Kyle Frost  Procedure(s) Performed: OPEN REDUCTION INTERNAL FIXATION (ORIF) ANKLE FRACTURE (Left: Ankle) SYNDESMOSIS REPAIR (Left: Ankle)  Patient Location: PACU  Anesthesia Type:General  Level of Consciousness: awake and drowsy  Airway & Oxygen Therapy: Patient Spontanous Breathing  Post-op Assessment: Report given to RN  Post vital signs: Reviewed and stable  Last Vitals:  Vitals Value Taken Time  BP 128/68 12/14/20 1110  Temp 36.8 C 12/14/20 1118  Pulse 73 12/14/20 1118  Resp 16 12/14/20 1118  SpO2 100 % 12/14/20 1118  Vitals shown include unvalidated device data.  Last Pain:  Vitals:   12/14/20 0631  PainSc: 0-No pain         Complications: No notable events documented.

## 2020-12-14 NOTE — Op Note (Signed)
12/14/2020  12:03 PM  PATIENT:  Kyle Frost    PRE-OPERATIVE DIAGNOSIS:  Left Ankle Displaced Fracture and syndesmotic disruption  POST-OPERATIVE DIAGNOSIS:  Same  PROCEDURE:  OPEN REDUCTION INTERNAL FIXATION (ORIF) ANKLE FRACTURE, SYNDESMOSIS REPAIR/STABILIZATION  SURGEON:  Thornton Park, MD  ANESTHESIA:   General  PREOPERATIVE INDICATIONS:  Kyle Frost is a  25 y.o. male with a diagnosis of Left Ankle Displaced Fracture and syndesmotic disruption.  Given the patient's displaced fracture and instability ankle secondary to syndesmotic disruption, patient was recommended for open reduction internal fixation of his lateral malleolus fracture and stabilization of the syndesmosis to allow for repair.  I discussed the risks and benefits of surgery. The risks include but are not limited to infection, bleeding requiring blood transfusion, nerve or blood vessel injury, joint stiffness or loss of motion, persistent pain, weakness or instability, malunion, nonunion and hardware failure and the need for further surgery. Medical risks include but are not limited to DVT and pulmonary embolism, myocardial infarction, stroke, pneumonia, respiratory failure and death. Patient understood these risks and wished to proceed.   OPERATIVE IMPLANTS: Synthes 10 hole one third tubular plate for lateral malleolus fixation.  Arthrex ankle tight rope for syndesmotic fixation.  58 mm 4.5 large fragment screw for additional syndesmotic fixation.  OPERATIVE FINDINGS: Comminuted fracture of the lateral malleolus with butterfly fragment and syndesmotic disruption with widening of the syndesmosis and asymmetry to the mortise.  OPERATIVE PROCEDURE:   Patient was met in the preoperative area.  A preop history and physical was performed at the bedside.  The left leg was signed my initials and the word yes according the hospital's correct site of surgery protocol.  The patient was brought to the operating room where he  underwent general anesthesia. The patient was placed supine on the operative table. A bump was placed under the left hip. A tourniquet was applied to the left thigh.  The lower extremity was prepped and draped in a sterile fashion. A timeout was performed to verify the patient's name, date of birth, medical record number, correct site of surgery and correct procedure to be performed. It was also used to verify the patient received antibiotics, and that all appropriate instruments, implants and radiographic studies were available in the room. Once all in attendance were in agreement, the case began.  The left lower extremity was exsanguinated with an Esmarch. The tourniquet was inflated to 250 mmHg. This was applied for a total of 88 minutes. A lateral incision was made over the fibula. The subcutaneous tissues were dissected with the Metzenbaum scissor and pickup. Care was taken to avoid injury to the superficial peroneal nerve. The lateral malleolus fracture was identified and irrigated and fracture hematoma was removed. Soft tissue was removed from the fracture site using a periosteal elevator. A fracture reduction clamp was then used to reduce the fracture to an anatomic position.     The lateral malleolus was then drilled in an AP direction, perpendicular to the fracture site to allow for placement of the lag screw.   A single lag screw, 20 mm in length, was advanced across the fracture site by hand. This compressed the fracture.   A 10 hole, 1/3 tubular plate was then contoured and placed along the lateral fibula. Bicortical screws were placed proximal to the fracture and fully threaded cancellus screws were placed distal the fracture. The fracture reduction and hardware placement were confirmed on AP and lateral imaging.  Once the lateral malleolus was plated, the attention  was turned to stabilization of the mortise.  An Arthrex tight rope was placed parallel to the articular surface of the tibia through  the one third tubular plate.  Four cortices were drilled allowing for passage of Arthrex tight rope button to the medial cortex.  A small stab incision was made over the medial side to ensure the button flipped and lay against the medial cortex.  The Arthrex Tight rope was tightened from the lateral side.  Following placement of the tight rope it was felt the syndesmosis still demonstrated instability.  Therefore the cortical screw just proximal to the tight rope was removed from the plate.  The decision was made to place a Synthes 4.5 large fragment screw.  A drill hole was made across 3 cortices parallel to the articular surface of the tibia.  A 58 mm 4.5 large fragment screw was advanced into position by hand reducing the syndesmosis.  The tight rope was then tightened down further for dual fixation.  A stress test was then performed of the left ankle which showed no syndesmotic widening or asymmetry to the mortise.  The medial and lateral incisions were then copiously irrigated. The subcutaneous tissue of the lateral incision was closed with a 2-0 Vicryl and the skin approximated staples.  The small medial stab incision was closed with staples.  Patient was injected with 0.5% Marcaine plain in the left ankle joint as well as throughout the surgical incisions.  A dry sterile dressing was applied along with an AO splint. The patient's ankle was positioned in neutral. The pateint was then awoken from anesthesia, transferred to hospital bed and brought to the PACU in stable condition. I was scrubbed and present the entire case and all sharp and instrument counts were correct at conclusion the case. I spoke to the patient's significant other by phone postoperatively to let her know the case was performed without complication and the patient was stable in recovery room.    Timoteo Gaul, MD

## 2020-12-14 NOTE — Discharge Instructions (Signed)
AMBULATORY SURGERY  ?DISCHARGE INSTRUCTIONS ? ? ?The drugs that you were given will stay in your system until tomorrow so for the next 24 hours you should not: ? ?Drive an automobile ?Make any legal decisions ?Drink any alcoholic beverage ? ? ?You may resume regular meals tomorrow.  Today it is better to start with liquids and gradually work up to solid foods. ? ?You may eat anything you prefer, but it is better to start with liquids, then soup and crackers, and gradually work up to solid foods. ? ? ?Please notify your doctor immediately if you have any unusual bleeding, trouble breathing, redness and pain at the surgery site, drainage, fever, or pain not relieved by medication. ? ? ? ?Additional Instructions: ? ? ? ?Please contact your physician with any problems or Same Day Surgery at [phone], Monday through Friday 6 am to 4 pm, or [place] [facility] [place] Main number at [phone].  ?
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
## 2020-12-15 ENCOUNTER — Encounter: Payer: Self-pay | Admitting: Emergency Medicine

## 2020-12-15 ENCOUNTER — Observation Stay
Admission: EM | Admit: 2020-12-15 | Discharge: 2020-12-15 | Disposition: A | Discharge status: Home / Self Care | Payer: Federal, State, Local not specified - PPO | Attending: Orthopedic Surgery | Admitting: Orthopedic Surgery

## 2020-12-15 ENCOUNTER — Other Ambulatory Visit: Payer: Self-pay

## 2020-12-15 DIAGNOSIS — G8918 Other acute postprocedural pain: Principal | ICD-10-CM | POA: Diagnosis present

## 2020-12-15 DIAGNOSIS — Z7982 Long term (current) use of aspirin: Secondary | ICD-10-CM | POA: Insufficient documentation

## 2020-12-15 DIAGNOSIS — Z20822 Contact with and (suspected) exposure to covid-19: Secondary | ICD-10-CM | POA: Diagnosis not present

## 2020-12-15 DIAGNOSIS — F17210 Nicotine dependence, cigarettes, uncomplicated: Secondary | ICD-10-CM | POA: Diagnosis not present

## 2020-12-15 LAB — RESP PANEL BY RT-PCR (FLU A&B, COVID) ARPGX2
Influenza A by PCR: NEGATIVE
Influenza B by PCR: NEGATIVE
SARS Coronavirus 2 by RT PCR: NEGATIVE

## 2020-12-15 MED ORDER — ONDANSETRON HCL 4 MG PO TABS: 4.0000 mg | ORAL_TABLET | Freq: Four times a day (QID) | ORAL | Status: DC | PRN

## 2020-12-15 MED ORDER — CEFAZOLIN SODIUM-DEXTROSE 2-4 GM/100ML-% IV SOLN
2.0000 g | Freq: Four times a day (QID) | INTRAVENOUS | Status: AC
  Administered 2020-12-15 (×2): 2 g via INTRAVENOUS
  Filled 2020-12-15 (×2): qty 100

## 2020-12-15 MED ORDER — ASPIRIN EC 325 MG PO TBEC
325.0000 mg | DELAYED_RELEASE_TABLET | Freq: Two times a day (BID) | ORAL | Status: DC
  Administered 2020-12-15 (×2): 325 mg via ORAL
  Filled 2020-12-15 (×2): qty 1

## 2020-12-15 MED ORDER — HYDROMORPHONE HCL 1 MG/ML IJ SOLN
1.0000 mg | Freq: Once | INTRAMUSCULAR | Status: AC
  Administered 2020-12-15: 1 mg via INTRAVENOUS
  Filled 2020-12-15: qty 1

## 2020-12-15 MED ORDER — NALOXONE HCL 2 MG/2ML IJ SOSY
0.4000 mg | PREFILLED_SYRINGE | INTRAMUSCULAR | Status: DC | PRN
  Filled 2020-12-15: qty 2

## 2020-12-15 MED ORDER — ACETAMINOPHEN 500 MG PO TABS
1000.0000 mg | ORAL_TABLET | Freq: Four times a day (QID) | ORAL | Status: DC
  Administered 2020-12-15 (×2): 1000 mg via ORAL
  Filled 2020-12-15 (×2): qty 2

## 2020-12-15 MED ORDER — SODIUM CHLORIDE 0.9 % IV SOLN
75.0000 mL/h | INTRAVENOUS | Status: DC
  Administered 2020-12-15: 75 mL/h via INTRAVENOUS

## 2020-12-15 MED ORDER — METHOCARBAMOL 500 MG PO TABS
500.0000 mg | ORAL_TABLET | Freq: Four times a day (QID) | ORAL | Status: DC | PRN
  Filled 2020-12-15: qty 1

## 2020-12-15 MED ORDER — ACETAMINOPHEN 325 MG PO TABS: 325.0000 mg | ORAL_TABLET | Freq: Four times a day (QID) | ORAL | Status: DC | PRN

## 2020-12-15 MED ORDER — ONDANSETRON HCL 4 MG/2ML IJ SOLN: 4.0000 mg | Freq: Four times a day (QID) | INTRAMUSCULAR | Status: DC | PRN

## 2020-12-15 MED ORDER — DOCUSATE SODIUM 100 MG PO CAPS
100.0000 mg | ORAL_CAPSULE | Freq: Two times a day (BID) | ORAL | Status: DC
  Administered 2020-12-15 (×2): 100 mg via ORAL
  Filled 2020-12-15 (×2): qty 1

## 2020-12-15 MED ORDER — METHOCARBAMOL 1000 MG/10ML IJ SOLN
500.0000 mg | Freq: Four times a day (QID) | INTRAVENOUS | Status: DC | PRN
  Filled 2020-12-15: qty 5

## 2020-12-15 MED ORDER — SENNA 8.6 MG PO TABS
1.0000 | ORAL_TABLET | Freq: Two times a day (BID) | ORAL | Status: DC
  Administered 2020-12-15 (×2): 8.6 mg via ORAL
  Filled 2020-12-15 (×2): qty 1

## 2020-12-15 MED ORDER — OXYCODONE HCL 5 MG PO TABS
5.0000 mg | ORAL_TABLET | ORAL | Status: DC | PRN
  Administered 2020-12-15 (×2): 10 mg via ORAL
  Filled 2020-12-15 (×2): qty 2

## 2020-12-15 MED ORDER — OXYCODONE HCL 5 MG PO TABS: 10.0000 mg | ORAL_TABLET | ORAL | Status: DC | PRN

## 2020-12-15 MED ORDER — POLYETHYLENE GLYCOL 3350 17 G PO PACK: 17.0000 g | PACK | Freq: Every day | ORAL | Status: DC | PRN

## 2020-12-15 MED ORDER — ALUM & MAG HYDROXIDE-SIMETH 200-200-20 MG/5ML PO SUSP: 30.0000 mL | ORAL | Status: DC | PRN

## 2020-12-15 MED ORDER — HYDROMORPHONE HCL 1 MG/ML IJ SOLN: 0.5000 mg | INTRAMUSCULAR | Status: DC | PRN

## 2020-12-15 MED ORDER — TRAMADOL HCL 50 MG PO TABS
50.0000 mg | ORAL_TABLET | Freq: Four times a day (QID) | ORAL | Status: DC
  Administered 2020-12-15 (×2): 50 mg via ORAL
  Filled 2020-12-15 (×2): qty 1

## 2020-12-15 MED ORDER — BISACODYL 10 MG RE SUPP: 10.0000 mg | Freq: Every day | RECTAL | Status: DC | PRN
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
## 2020-12-15 NOTE — ED Triage Notes (Signed)
Pt to triage via w/c, appears uncomfortable; ORIF of left ankle yesterday; d/c with oxycodone but pain uncontrolled; was told to come to ED for pain control

## 2020-12-15 NOTE — Progress Notes (Signed)
Per chart review pt with OT orders beginning on 12/15/20 and PT orders beginning 12/16/20. Spoke with MD via secure chat who requests PT see pt today (beginning 12/15/20). PT evaluation completed, please see note to follow.  Aleda Grana, PT, DPT 12/15/20, 11:11 AM
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
## 2020-12-15 NOTE — Progress Notes (Signed)
OT Cancellation Note  Patient Details Name: Isaiyah Feldhaus MRN: [id number] DOB: [birth year]   Cancelled Treatment:    Reason Eval/Treat Not Completed: OT screened, no needs identified, will sign off.Order received, chart reviewed. Per conversation w/ PT, pt at functional baseline for mobility. Pt reports having shower chair, good family support, and denies OT needs. Will sign off, no acute needs identified.   [place] [facility], OTS   [place] [facility] 12/15/2020, 10:01 AM
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
## 2020-12-15 NOTE — Plan of Care (Signed)
No acute events during the night. VSS. PRN oxycodone administered x 1. NS infusing @ 75.  Problem: Education: Goal: Knowledge of General Education information will improve Description: Including pain rating scale, medication(s)/side effects and non-pharmacologic comfort measures Outcome: Progressing   Problem: Health Behavior/Discharge Planning: Goal: Ability to manage health-related needs will improve Outcome: Progressing   Problem: Clinical Measurements: Goal: Ability to maintain clinical measurements within normal limits will improve Outcome: Progressing Goal: Will remain free from infection Outcome: Progressing Goal: Diagnostic test results will improve Outcome: Progressing Goal: Respiratory complications will improve Outcome: Progressing Goal: Cardiovascular complication will be avoided Outcome: Progressing   Problem: Activity: Goal: Risk for activity intolerance will decrease Outcome: Progressing   Problem: Nutrition: Goal: Adequate nutrition will be maintained Outcome: Progressing   Problem: Coping: Goal: Level of anxiety will decrease Outcome: Progressing   Problem: Elimination: Goal: Will not experience complications related to bowel motility Outcome: Progressing Goal: Will not experience complications related to urinary retention Outcome: Progressing   Problem: Pain Managment: Goal: General experience of comfort will improve Outcome: Progressing   Problem: Safety: Goal: Ability to remain free from injury will improve Outcome: Progressing   Problem: Skin Integrity: Goal: Risk for impaired skin integrity will decrease Outcome: Progressing
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
## 2022-01-28 IMAGING — DX DG ANKLE COMPLETE 3+V*L*
3 series · 3 of 3 positions shown · non-contrast
Comparison: 11/24/2020

CLINICAL DATA: Status post left ankle surgery

EXAM:
LEFT ANKLE COMPLETE - 3 VIEW

[ankle ap]
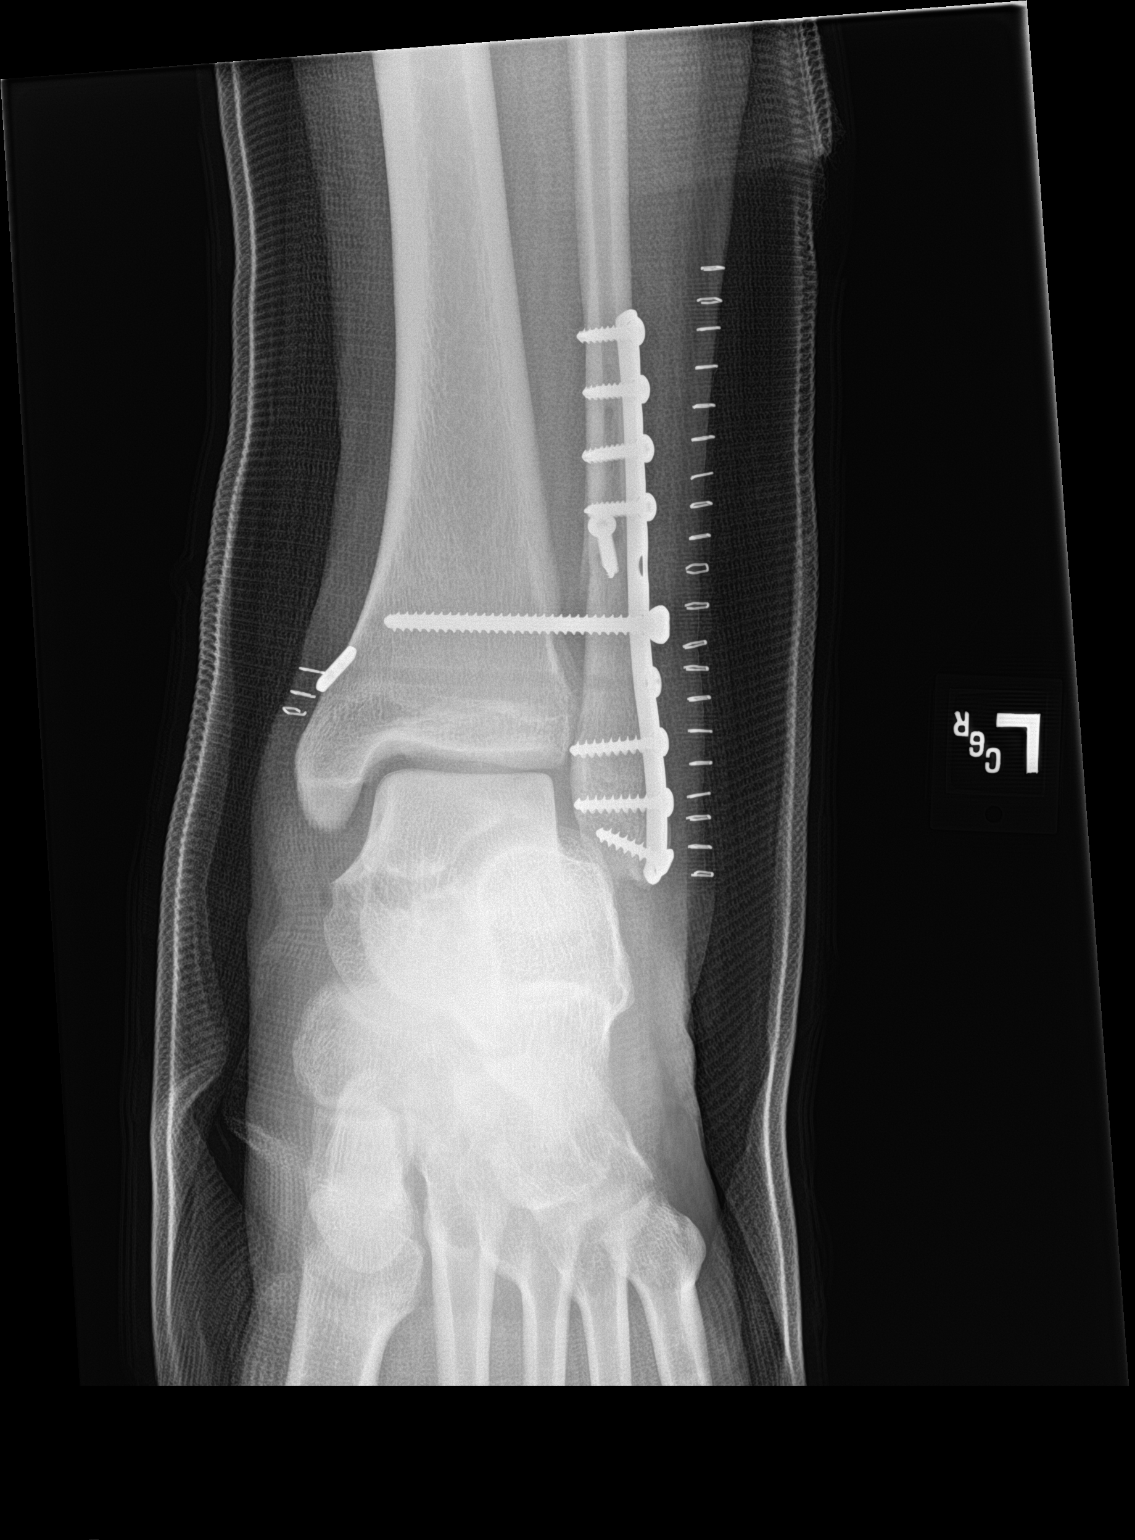

[ankle obl]
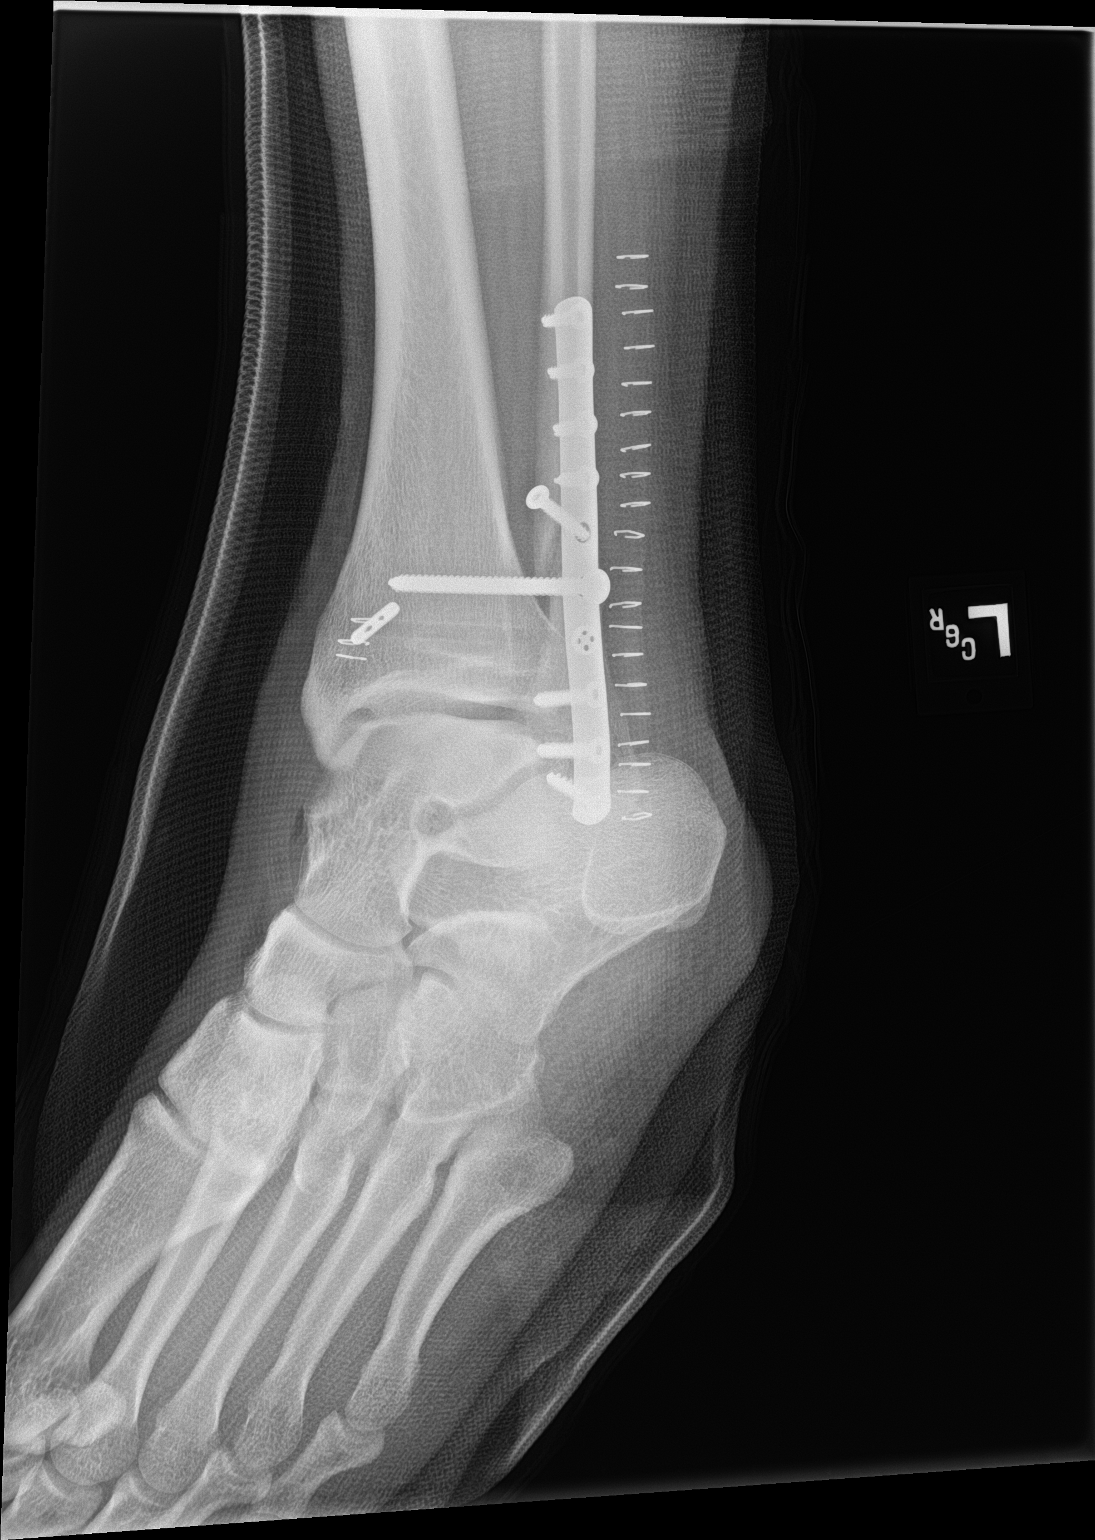

[ankle lat]
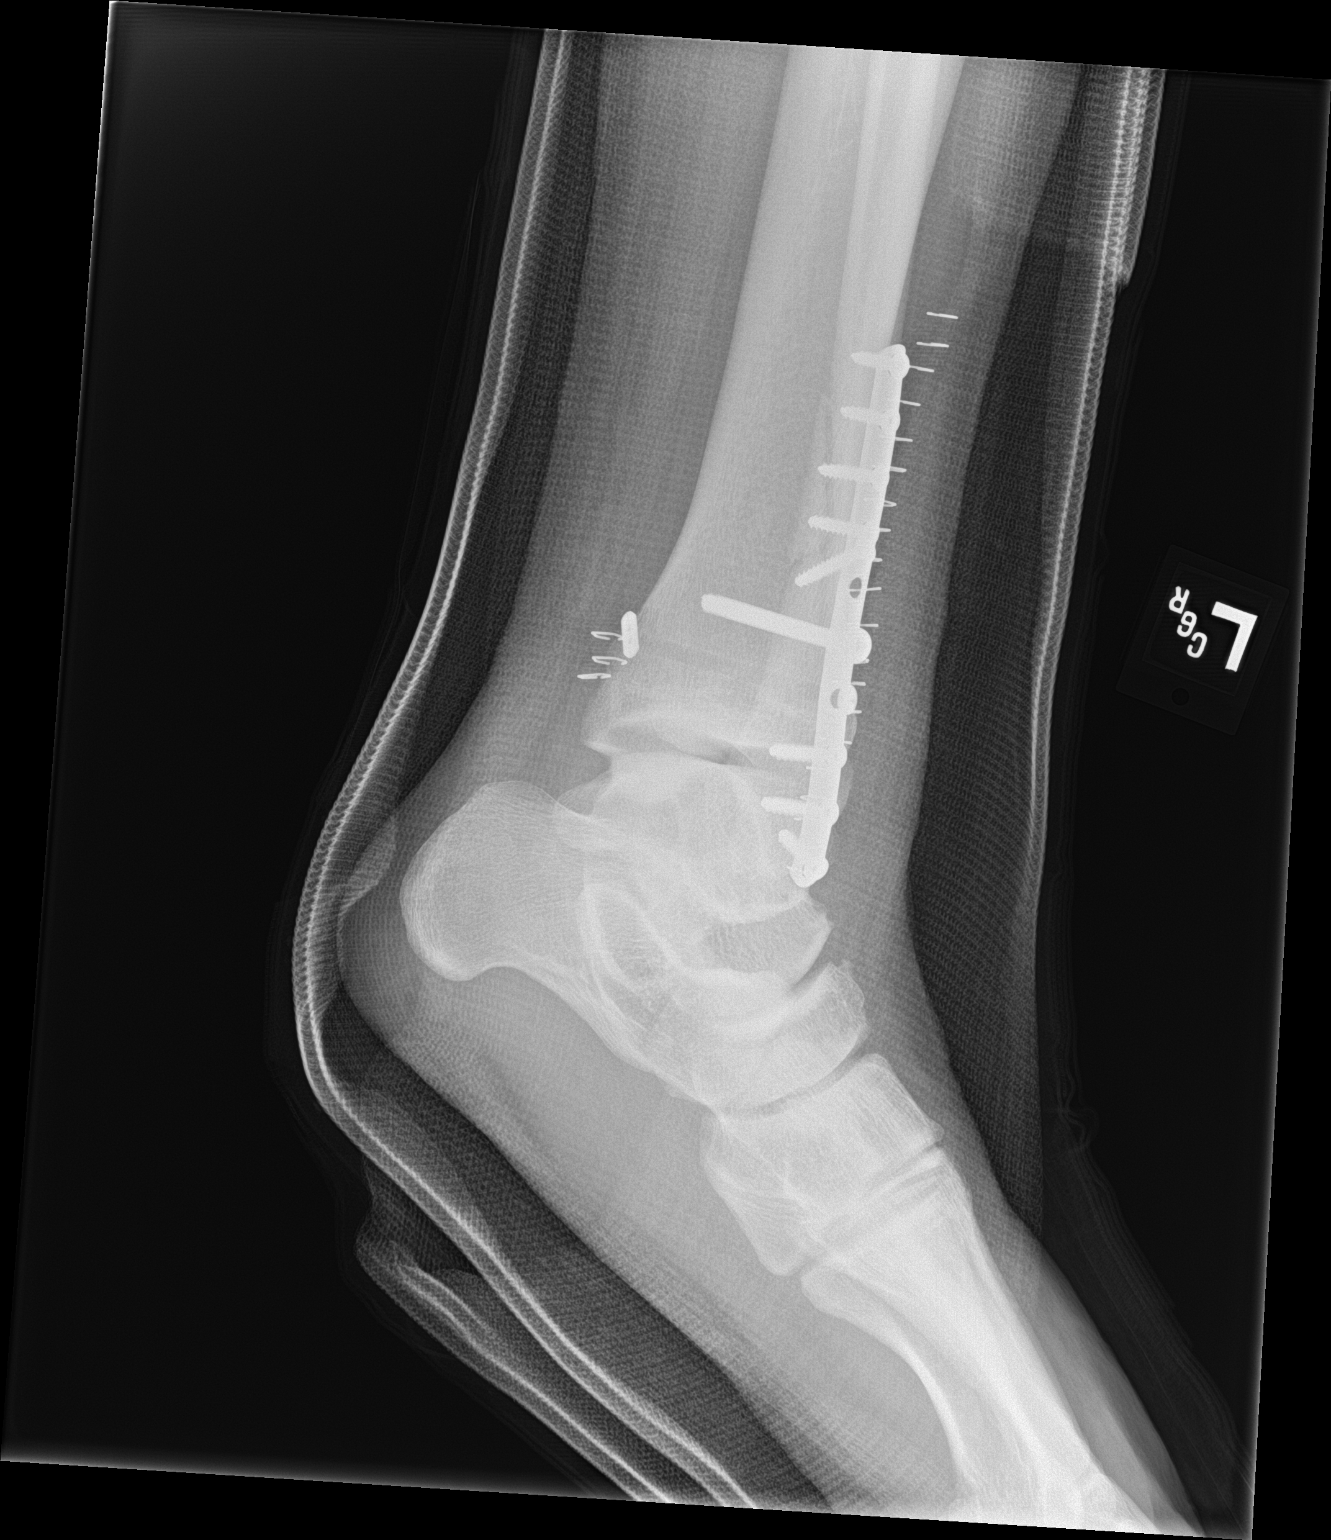

[3 of 3 positions shown; findings below may reference images not displayed]

FINDINGS: Overlying cast material obscures fine bony detail. Status post plate
and screw fixation of a previously noted distal fibular fracture.
The fracture fragments are in near anatomic alignment. No evidence
of hardware fracture. No new osseous abnormality. Superficial skin
staples.
IMPRESSION: Expected appearance, status post plate and screw fixation of the
previously noted distal fibular fracture.

## 2022-04-18 ENCOUNTER — Emergency Department
Admission: EM | Admit: 2022-04-18 | Discharge: 2022-04-18 | Disposition: A | Discharge status: Home / Self Care | Payer: Self-pay | Attending: Emergency Medicine | Admitting: Emergency Medicine

## 2022-04-18 ENCOUNTER — Emergency Department: Payer: Self-pay

## 2022-04-18 ENCOUNTER — Other Ambulatory Visit: Payer: Self-pay

## 2022-04-18 DIAGNOSIS — Y92410 Unspecified street and highway as the place of occurrence of the external cause: Secondary | ICD-10-CM | POA: Diagnosis not present

## 2022-04-18 DIAGNOSIS — M549 Dorsalgia, unspecified: Secondary | ICD-10-CM | POA: Diagnosis not present

## 2022-04-18 DIAGNOSIS — R519 Headache, unspecified: Secondary | ICD-10-CM | POA: Insufficient documentation

## 2022-04-18 DIAGNOSIS — R079 Chest pain, unspecified: Secondary | ICD-10-CM | POA: Diagnosis not present

## 2022-04-18 DIAGNOSIS — M79602 Pain in left arm: Secondary | ICD-10-CM | POA: Insufficient documentation

## 2022-04-18 DIAGNOSIS — M25572 Pain in left ankle and joints of left foot: Secondary | ICD-10-CM | POA: Diagnosis not present

## 2022-04-18 DIAGNOSIS — B360 Pityriasis versicolor: Secondary | ICD-10-CM | POA: Insufficient documentation

## 2022-04-18 DIAGNOSIS — M7918 Myalgia, other site: Secondary | ICD-10-CM

## 2022-04-18 MED ORDER — ACETAMINOPHEN 325 MG PO TABS
650.0000 mg | ORAL_TABLET | Freq: Once | ORAL | Status: AC
  Administered 2022-04-18: 650 mg via ORAL
  Filled 2022-04-18: qty 2

## 2022-04-18 MED ORDER — IBUPROFEN 800 MG PO TABS
800.0000 mg | ORAL_TABLET | Freq: Three times a day (TID) | ORAL | 0 refills | Status: AC | PRN
Start: 2022-04-18 — End: ?

## 2022-04-18 MED ORDER — CYCLOBENZAPRINE HCL 5 MG PO TABS: 5.0000 mg | ORAL_TABLET | Freq: Three times a day (TID) | ORAL | 0 refills | Status: AC | PRN
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
